# Patient Record
Sex: Male | Born: 1960 | Race: White | Hispanic: Yes | Marital: Single | Smoking: Never smoker
Health system: Southern US, Community
[De-identification: ages and names within clinical notes are randomized; demographics above are authoritative.]

## PROBLEM LIST (undated history)

## (undated) ENCOUNTER — Emergency Department (HOSPITAL_BASED_OUTPATIENT_CLINIC_OR_DEPARTMENT_OTHER): Admission: EM | Payer: Self-pay | Source: Home / Self Care

## (undated) DIAGNOSIS — T7840XA Allergy, unspecified, initial encounter: Secondary | ICD-10-CM

## (undated) DIAGNOSIS — M549 Dorsalgia, unspecified: Secondary | ICD-10-CM

## (undated) HISTORY — DX: Allergy, unspecified, initial encounter: T78.40XA

## (undated) HISTORY — PX: SHOULDER ARTHROSCOPY: SHX128

## (undated) HISTORY — PX: HIP SURGERY: SHX245

---

## 2014-02-05 ENCOUNTER — Telehealth: Payer: Self-pay | Admitting: Emergency Medicine

## 2014-02-05 NOTE — Telephone Encounter (Signed)
Called by patient from the airport in [place] with c/o CP and SOB onset 8 months ago. He was concerned about this as he was about to get on plane to [place]. He has never been seen by us, but feels that he should be seen soon. My attempts to discern whether he was experiencing acute distress were difficult. I underscored with him that if he were having acute CP, dyspnea or felt unstable in any way that he should seek care locally. He assured me that he felt safe and stable to fly home tonight, but he did understand my instructions should his sx change. I told him I would ask the office to call him as a self referral for pulm eval, new consult, on 12/9.   Please set him up next available with any provider, call him with the details. Thank you

## 2014-02-06 NOTE — Telephone Encounter (Signed)
Pt offered multiple appts with Dr Sherene SiresWert to try and get the patient in to be seen ASAP--pt refused to see Dr Sherene SiresWert. Pt given multiple Drs names and pt refused stating that per reviews and convenience he wishes to see Dr Vassie LollAlva. Appt given to pt to see Dr Vassie LollAlva 03/15/13 at 11:15. Pt aware to arrive [id number] early to fill out paperwork, bring any related paperwork to appt pertaining to current symptoms.  Nothing further needed.

## 2014-02-06 NOTE — Telephone Encounter (Signed)
Patient returning call.  [phone]

## 2014-02-06 NOTE — Telephone Encounter (Signed)
lmtcb for pt.  

## 2014-02-11 ENCOUNTER — Ambulatory Visit (INDEPENDENT_AMBULATORY_CARE_PROVIDER_SITE_OTHER): Payer: BC Managed Care – PPO | Admitting: Emergency Medicine

## 2014-02-11 ENCOUNTER — Ambulatory Visit (INDEPENDENT_AMBULATORY_CARE_PROVIDER_SITE_OTHER): Payer: BC Managed Care – PPO

## 2014-02-11 VITALS — BP 108/76 | HR 59 | Temp 98.0°F | Resp 16 | Ht 68.0 in | Wt 177.0 lb

## 2014-02-11 DIAGNOSIS — R0602 Shortness of breath: Secondary | ICD-10-CM

## 2014-02-11 DIAGNOSIS — M940 Chondrocostal junction syndrome [Tietze]: Secondary | ICD-10-CM

## 2014-02-11 MED ORDER — NAPROXEN SODIUM 550 MG PO TABS: 550.0000 mg | ORAL_TABLET | Freq: Two times a day (BID) | ORAL | Status: DC

## 2014-02-11 NOTE — Patient Instructions (Signed)
Costochondritis Costochondritis, sometimes called Tietze syndrome, is a swelling and irritation (inflammation) of the tissue (cartilage) that connects your ribs with your breastbone (sternum). It causes pain in the chest and rib area. Costochondritis usually goes away on its own over time. It can take up to 6 weeks or longer to get better, especially if you are unable to limit your activities. CAUSES  Some cases of costochondritis have no known cause. Possible causes include:  Injury (trauma).  Exercise or activity such as lifting.  Severe coughing. SIGNS AND SYMPTOMS  Pain and tenderness in the chest and rib area.  Pain that gets worse when coughing or taking deep breaths.  Pain that gets worse with specific movements. DIAGNOSIS  Your health care provider will do a physical exam and ask about your symptoms. Chest X-rays or other tests may be done to rule out other problems. TREATMENT  Costochondritis usually goes away on its own over time. Your health care provider may prescribe medicine to help relieve pain. HOME CARE INSTRUCTIONS   Avoid exhausting physical activity. Try not to strain your ribs during normal activity. This would include any activities using chest, abdominal, and side muscles, especially if heavy weights are used.  Apply ice to the affected area for the first 2 days after the pain begins.  Put ice in a plastic bag.  Place a towel between your skin and the bag.  Leave the ice on for 20 minutes, 2-3 times a day.  Only take over-the-counter or prescription medicines as directed by your health care provider. SEEK MEDICAL CARE IF:  You have redness or swelling at the rib joints. These are signs of infection.  Your pain does not go away despite rest or medicine. SEEK IMMEDIATE MEDICAL CARE IF:   Your pain increases or you are very uncomfortable.  You have shortness of breath or difficulty breathing.  You cough up blood.  You have worse chest pains,  sweating, or vomiting.  You have a fever or persistent symptoms for more than 2-3 days.  You have a fever and your symptoms suddenly get worse. MAKE SURE YOU:   Understand these instructions.  Will watch your condition.  Will get help right away if you are not doing well or get worse. Document Released: 11/25/2004 Document Revised: 12/06/2012 Document Reviewed: 09/19/2012 ExitCare Patient Information 2015 [employer]. This information is not intended to replace advice given to you by your health care provider. Make sure you discuss any questions you have with your health care provider.  

## 2014-02-11 NOTE — Progress Notes (Signed)
[facility] [address], [place] [address] [phone]  Date:  02/11/2014   Name:  Gordon Martinez   DOB:  1960/09/09   MRN:  [id number]  PCP:  No primary care provider on file.    Chief Complaint: Chest Pain and Nasal Congestion   History of Present Illness:  Gordon Martinez is a 53 y.o. very pleasant male patient who presents with the following:  Treated 8 months ago when he developed "congestion from the flu"  He describes this as a persistent cough, wheezing and shortness of breath. Symptoms have not improved and he sought an evaluation from a cardiologist who cleared him.  Has tenderness and has been rubbing his mid chest. Has not had a chest xray during this time frame. Non smoker.  CPA.   No fever or chills.  Cough is not productive. Says today feels that he has a "ball in his chest" as though he was punched in the chest.  Started Saturday and persists without interruption. Non radiating and no diaphoresis or nausea or vomiting. No HBP, DM or HLD Post nasal drainage. No improvement with over the counter medications or other home remedies.  Denies other complaint or health concern today.    There are no active problems to display for this patient.   Past Medical History  Diagnosis Date  . Allergy     Past Surgical History  Procedure Laterality Date  . Hip surgery      History  Substance Use Topics  . Smoking status: Never Smoker   . Smokeless tobacco: Not on file  . Alcohol Use: Not on file    No family history on file.  No Known Allergies  Medication list has been reviewed and updated.  No current outpatient prescriptions on file prior to visit.   No current facility-administered medications on file prior to visit.    Review of Systems:  As per HPI, otherwise negative.    Physical Examination: Filed Vitals:   02/11/14 1740  BP: 108/76  Pulse: 59  Temp: 98 F (36.7 C)  Resp: 16   Filed Vitals:   02/11/14 1740   Height: 5\' 8"  (1.727 m)  Weight: 177 lb (80.287 kg)   Body mass index is 26.92 kg/(m^2). Ideal Body Weight: Weight in (lb) to have BMI = 25: 164.1  GEN: WDWN, NAD, Non-toxic, A & O x 3 HEENT: Atraumatic, Normocephalic. Neck supple. No masses, No LAD. Ears and Nose: No external deformity. CV: RRR, No M/G/R. No JVD. No thrill. No extra heart sounds. Chest:  Tender 5th ICS costochondral joints PULM: CTA B, no wheezes, crackles, rhonchi. No retractions. No resp. distress. No accessory muscle use. ABD: S, NT, ND, +BS. No rebound. No HSM. EXTR: No c/c/e NEURO Normal gait.  PSYCH: Normally interactive. Conversant. Not depressed or anxious appearing.  Calm demeanor.    Assessment and Plan: Costochondritis Anaprox Red flags  Follow up if not relieved or new or worsened symptoms.  Signed,  Phillips OdorJeffery Wallice [place], MD   [facility] reading (PRIMARY) by  Dr. Dareen PianoAnderson negative chest with possible lesion left hilum.

## 2014-03-15 ENCOUNTER — Institutional Professional Consult (permissible substitution): Payer: Self-pay | Admitting: Pulmonary Disease

## 2014-04-24 ENCOUNTER — Institutional Professional Consult (permissible substitution): Payer: Self-pay | Admitting: Pulmonary Disease

## 2014-06-24 ENCOUNTER — Ambulatory Visit (INDEPENDENT_AMBULATORY_CARE_PROVIDER_SITE_OTHER): Payer: BLUE CROSS/BLUE SHIELD | Admitting: Pulmonary Disease

## 2014-06-24 ENCOUNTER — Encounter: Payer: Self-pay | Admitting: Pulmonary Disease

## 2014-06-24 ENCOUNTER — Encounter (INDEPENDENT_AMBULATORY_CARE_PROVIDER_SITE_OTHER): Payer: Self-pay

## 2014-06-24 VITALS — BP 100/64 | HR 67 | Ht 69.0 in | Wt 177.0 lb

## 2014-06-24 DIAGNOSIS — J989 Respiratory disorder, unspecified: Secondary | ICD-10-CM | POA: Diagnosis not present

## 2014-06-24 DIAGNOSIS — R0600 Dyspnea, unspecified: Secondary | ICD-10-CM

## 2014-06-24 DIAGNOSIS — R0602 Shortness of breath: Secondary | ICD-10-CM | POA: Diagnosis not present

## 2014-06-24 DIAGNOSIS — J399 Disease of upper respiratory tract, unspecified: Secondary | ICD-10-CM

## 2014-06-24 NOTE — Patient Instructions (Signed)
Lung function is good Do not need asthma medications ENT evaluation - if OK, we can discuss vocal cord dysfunction

## 2014-06-24 NOTE — Assessment & Plan Note (Signed)
he seems to indicate that he has an upper airway problem. I do not hear any stridor. He does not have a goiter. Spirometry appears normal and history is not typical for asthma. As such I doubt that further pulmonary workup is necessary here. I will refer him for an ENT evaluation. If that is normal, we will observe him for a period of time. If symptoms persist, we can evaluate for sinusitis or GERD. I would also consider a barium swallow . 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 He does not seem to be interested at this time.

## 2014-06-24 NOTE — Progress Notes (Signed)
   Subjective:    Patient ID: Ulice BrilliantGeorge Rebman, male    DOB: 1961-01-30, 54 y.o.   MRN: [id number]  HPI  54 year old never smoker presents for evaluation of shortness of breath. His main complaint is tightness of throat ongoing since 04/2013. This occurs at rest as well as on exercise, no specific triggers or alleviating factors. He denies difficulty swallowing liquids or solids. He denies cough, chest pain or wheezing His father had a thyroid nodule and if he wonders if this could be causing his problems. Urgent care visit in 01/2014, chest x-ray within normal limits He was evaluated by a cardiac stress test which was reportedly normal. Spirometry 05/2014 does not show any evidence of airway obstruction, ratio 80, FEV1 96% and FVC 95%. He has 5 cats including 2 from his daughter about 2 years ago. Otherwise he denies any changes in his environment. He denies nocturnal wheezing or dyspnea. 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 He is originally from [place], lived in [place], in [place] the last 16 years. He is divorced, denies substance abuse He does a heavy workout including running about an hour 5 times a week  Past Medical History  Diagnosis Date  . Allergy     Past Surgical History  Procedure Laterality Date  . Hip surgery      No Known Allergies  History   Social History  . Marital Status: Single    Spouse Name: N/A  . Number of Children: N/A  . Years of Education: N/A   Occupational History  . Not on file.   Social History Main Topics  . Smoking status: Never Smoker   . Smokeless tobacco: Not on file  . Alcohol Use: Not on file  . Drug Use: Not on file  . Sexual Activity: Not on file   Other Topics Concern  . Not on file   Social History Narrative    No family history on file.   Review of Systems  Constitutional: Negative for fever, chills, activity change, appetite change and unexpected weight change.  HENT: Negative for  congestion, dental problem, postnasal drip, rhinorrhea, sneezing, sore throat, trouble swallowing and voice change.   Eyes: Negative for visual disturbance.  Respiratory: Positive for shortness of breath. Negative for cough and choking.   Cardiovascular: Negative for chest pain and leg swelling.  Gastrointestinal: Negative for nausea, vomiting and abdominal pain.  Genitourinary: Negative for difficulty urinating.  Musculoskeletal: Negative for arthralgias.  Skin: Negative for rash.  Psychiatric/Behavioral: Negative for behavioral problems and confusion.       Objective:   Physical Exam  Gen. Pleasant, well-nourished, in no distress, normal affect ENT - no lesions, no post nasal drip Neck: No JVD, no thyromegaly, no carotid bruits Lungs: no use of accessory muscles, no dullness to percussion, clear without rales or rhonchi  Cardiovascular: Rhythm regular, heart sounds  normal, no murmurs or gallops, no peripheral edema Abdomen: soft and non-tender, no hepatosplenomegaly, BS normal. Musculoskeletal: No deformities, no cyanosis or clubbing Neuro:  alert, non focal       Assessment & Plan:

## 2014-07-10 ENCOUNTER — Other Ambulatory Visit: Payer: Self-pay | Admitting: Otolaryngology

## 2014-07-10 DIAGNOSIS — R1310 Dysphagia, unspecified: Secondary | ICD-10-CM

## 2014-07-10 DIAGNOSIS — R0789 Other chest pain: Secondary | ICD-10-CM

## 2014-07-10 DIAGNOSIS — R0689 Other abnormalities of breathing: Secondary | ICD-10-CM

## 2014-07-24 ENCOUNTER — Other Ambulatory Visit: Payer: Self-pay | Admitting: Otolaryngology

## 2014-07-24 ENCOUNTER — Ambulatory Visit
Admission: RE | Admit: 2014-07-24 | Discharge: 2014-07-24 | Disposition: A | Discharge status: Home / Self Care | Payer: BLUE CROSS/BLUE SHIELD | Source: Ambulatory Visit | Attending: Otolaryngology | Admitting: Otolaryngology

## 2014-07-24 DIAGNOSIS — R1310 Dysphagia, unspecified: Secondary | ICD-10-CM

## 2014-07-24 DIAGNOSIS — R0689 Other abnormalities of breathing: Secondary | ICD-10-CM

## 2014-07-24 DIAGNOSIS — J383 Other diseases of vocal cords: Secondary | ICD-10-CM

## 2014-07-24 DIAGNOSIS — R0789 Other chest pain: Secondary | ICD-10-CM

## 2014-07-24 MED ORDER — IOPAMIDOL (ISOVUE-300) INJECTION 61%
75.0000 mL | Freq: Once | INTRAVENOUS | Status: AC | PRN
  Administered 2014-07-24: 75 mL via INTRAVENOUS

## 2014-08-06 ENCOUNTER — Telehealth: Payer: Self-pay | Admitting: Pulmonary Disease

## 2014-08-06 NOTE — Telephone Encounter (Signed)
ENT eval neg ([facility]) CT neck neg

## 2015-12-24 ENCOUNTER — Emergency Department (HOSPITAL_BASED_OUTPATIENT_CLINIC_OR_DEPARTMENT_OTHER)
Admission: EM | Admit: 2015-12-24 | Discharge: 2015-12-24 | Disposition: A | Discharge status: Home / Self Care | Payer: BLUE CROSS/BLUE SHIELD | Attending: Emergency Medicine | Admitting: Emergency Medicine

## 2015-12-24 ENCOUNTER — Encounter (HOSPITAL_BASED_OUTPATIENT_CLINIC_OR_DEPARTMENT_OTHER): Payer: Self-pay | Admitting: *Deleted

## 2015-12-24 DIAGNOSIS — G8929 Other chronic pain: Secondary | ICD-10-CM | POA: Insufficient documentation

## 2015-12-24 DIAGNOSIS — M545 Low back pain, unspecified: Secondary | ICD-10-CM

## 2015-12-24 DIAGNOSIS — Z79891 Long term (current) use of opiate analgesic: Secondary | ICD-10-CM | POA: Diagnosis not present

## 2015-12-24 HISTORY — DX: Dorsalgia, unspecified: M54.9

## 2015-12-24 MED ORDER — METHOCARBAMOL 500 MG PO TABS
1000.0000 mg | ORAL_TABLET | Freq: Once | ORAL | Status: AC
  Administered 2015-12-24: 1000 mg via ORAL
  Filled 2015-12-24: qty 2

## 2015-12-24 MED ORDER — METHOCARBAMOL 500 MG PO TABS: 1000.0000 mg | ORAL_TABLET | Freq: Three times a day (TID) | ORAL | 0 refills | Status: DC | PRN

## 2015-12-24 MED ORDER — KETOROLAC TROMETHAMINE 60 MG/2ML IM SOLN
60.0000 mg | Freq: Once | INTRAMUSCULAR | Status: AC
  Administered 2015-12-24: 60 mg via INTRAMUSCULAR
  Filled 2015-12-24: qty 2

## 2015-12-24 MED ORDER — IBUPROFEN 600 MG PO TABS: 600.0000 mg | ORAL_TABLET | Freq: Four times a day (QID) | ORAL | 0 refills | Status: DC | PRN

## 2015-12-24 MED FILL — METHOCARBAMOL 500 MG TABLET: 500 | 5 days supply | Qty: 30 | Fill #0

## 2015-12-24 MED FILL — IBUPROFEN 600 MG TABLET: 600 | 7 days supply | Qty: 30 | Fill #0

## 2015-12-24 NOTE — Discharge Instructions (Signed)
Follow-up with Dr Leeanne Rio'gara for MRI and re-evaluation. Return for fever, weakness, numbness, incontinence or for any concerns.

## 2015-12-24 NOTE — ED Provider Notes (Signed)
[facility]   CSN: [id number] Arrival date & time: 12/24/15  1627     History   Chief Complaint Chief Complaint  Patient presents with  . Back Pain    HPI Gordon Martinez is a 55 y.o. male.  HPI Patient presents with episodic low back pain described as spasms. Pain does not radiate to the abdomen or to the legs. No urinary incontinence or retention. Denies hematuria. No fever or chills. No weakness or numbness.Patient's thinks he had a similar episode of back pain sometime earlier this year. He's taken hydrocodone at home with little relief. States pain worsened this morning after bending over. Patient is asking for MRI. Past Medical History:  Diagnosis Date  . Allergy   . Back pain     Patient Active Problem List   Diagnosis Date Noted  . Dyspnea 06/24/2014    Past Surgical History:  Procedure Laterality Date  . HIP SURGERY    . SHOULDER ARTHROSCOPY         Home Medications    Prior to Admission medications   Medication Sig Start Date End Date Taking? Authorizing Provider  traMADol (ULTRAM) 50 MG tablet Take 50 mg by mouth every 6 (six) hours as needed.   Yes Historical Provider, MD  ibuprofen (ADVIL,MOTRIN) 600 MG tablet Take 1 tablet (600 mg total) by mouth every 6 (six) hours as needed. 12/24/15   Loren Raceravid Lafreda Casebeer, MD  methocarbamol (ROBAXIN) 500 MG tablet Take 2 tablets (1,000 mg total) by mouth every 8 (eight) hours as needed for muscle spasms. 12/24/15   Loren Raceravid Catalino Plascencia, MD    Family History History reviewed. No pertinent family history.  Social History Social History  Substance Use Topics  . Smoking status: Never Smoker  . Smokeless tobacco: Not on file  . Alcohol use No     Allergies   Review of patient's allergies indicates no known allergies.   Review of Systems Review of Systems  Constitutional: Negative for chills and fever.  Respiratory: Negative for shortness of breath.   Cardiovascular: Negative for chest  pain.  Gastrointestinal: Negative for abdominal pain, constipation, diarrhea, nausea and vomiting.  Genitourinary: Negative for difficulty urinating, dysuria, flank pain, frequency, hematuria and urgency.  Musculoskeletal: Positive for back pain and myalgias. Negative for neck pain and neck stiffness.  Skin: Negative for pallor, rash and wound.  Neurological: Negative for dizziness, syncope, weakness, light-headedness, numbness and headaches.  All other systems reviewed and are negative.    Physical Exam Updated Vital Signs BP (!) 115/102 (BP Location: Right Arm)   Pulse 64   Temp 98.1 F (36.7 C) (Oral)   Resp 18   Ht 5\' 8"  (1.727 m)   Wt 175 lb (79.4 kg)   SpO2 99%   BMI 26.61 kg/m   Physical Exam  Constitutional: He is oriented to person, place, and time. He appears well-developed and well-nourished.  Quite anxious  HENT:  Head: Normocephalic and atraumatic.  Mouth/Throat: Oropharynx is clear and moist.  Eyes: EOM are normal. Pupils are equal, round, and reactive to light.  Neck: Normal range of motion. Neck supple.  Cardiovascular: Normal rate and regular rhythm.   Pulmonary/Chest: Effort normal and breath sounds normal.  Abdominal: Soft. Bowel sounds are normal. There is no tenderness. There is no rebound and no guarding.  No pulsatile masses  Musculoskeletal: Normal range of motion. He exhibits tenderness. He exhibits no edema or deformity.  Mild midline lower lumbar/sacral tenderness. Negative straight leg raise.No lower extremity  swelling or asymmetry.Distal pulses are equal  Neurological: He is alert and oriented to person, place, and time.  Patient is alert and oriented x3 with clear, goal oriented speech. Patient has 5/5 motor in all extremities. Sensation is intact to light touch. No saddle anesthesia  Skin: Skin is warm and dry. Capillary refill takes less than 2 seconds. No rash noted. No erythema.  Psychiatric: His behavior is normal.  Nursing note and vitals  reviewed.    ED Treatments / Results  Labs (all labs ordered are listed, but only abnormal results are displayed) Labs Reviewed - No data to display  EKG  EKG Interpretation None       Radiology No results found.  Procedures Procedures (including critical care time)  Medications Ordered in ED Medications  ketorolac (TORADOL) injection 60 mg (60 mg Intramuscular Given 12/24/15 1707)  methocarbamol (ROBAXIN) tablet 1,000 mg (1,000 mg Oral Given 12/24/15 1707)     Initial Impression / Assessment and Plan / ED Course  I have reviewed the triage vital signs and the nursing notes.  Pertinent labs & imaging results that were available during my care of the patient were reviewed by me and considered in my medical decision making (see chart for details).  Clinical Course   Review of patient's chart from care everywhere demonstrates that [facility] for similar pain. MRI of his lumbar spine as an outpatient is being arranged. No red flag signs or symptoms.We'll treat with anti-inflammatory and muscle relaxants. Patient is encouraged to follow-up with his orthopedist. Strict return precautions have been given.  Final Clinical Impressions(s) / ED Diagnoses   Final diagnoses:  Chronic midline low back pain without sciatica    New Prescriptions New Prescriptions   IBUPROFEN (ADVIL,MOTRIN) 600 MG TABLET    Take 1 tablet (600 mg total) by mouth every 6 (six) hours as needed.   METHOCARBAMOL (ROBAXIN) 500 MG TABLET    Take 2 tablets (1,000 mg total) by mouth every 8 (eight) hours as needed for muscle spasms.     Loren Racer, MD 12/24/15 [phone]

## 2017-04-30 ENCOUNTER — Other Ambulatory Visit: Payer: Self-pay

## 2017-04-30 ENCOUNTER — Encounter: Payer: Self-pay | Admitting: Family Medicine

## 2017-04-30 ENCOUNTER — Ambulatory Visit: Payer: BLUE CROSS/BLUE SHIELD | Admitting: Family Medicine

## 2017-04-30 VITALS — BP 125/76 | HR 72 | Temp 99.5°F | Resp 17 | Ht 68.0 in | Wt 178.0 lb

## 2017-04-30 DIAGNOSIS — J9801 Acute bronchospasm: Secondary | ICD-10-CM | POA: Diagnosis not present

## 2017-04-30 DIAGNOSIS — J0100 Acute maxillary sinusitis, unspecified: Secondary | ICD-10-CM

## 2017-04-30 MED ORDER — FLUTICASONE PROPIONATE 50 MCG/ACT NA SUSP: 2.0000 | Freq: Every day | NASAL | 6 refills | Status: AC

## 2017-04-30 MED ORDER — AZITHROMYCIN 250 MG PO TABS: ORAL_TABLET | ORAL | 0 refills | Status: AC

## 2017-04-30 NOTE — Patient Instructions (Addendum)
1 teaspoon of salt dissolved in 8 ounces of warm water to gargle    IF you received an x-ray today, you will receive an invoice from [facility]. Please contact [facility] at [phone] with questions or concerns regarding your invoice.   IF you received labwork today, you will receive an invoice from [facility]. Please contact [facility] at [phone] with questions or concerns regarding your invoice.   Our billing staff will not be able to assist you with questions regarding bills from these companies.  You will be contacted with the lab results as soon as they are available. The fastest way to get your results is to activate your My Chart account. Instructions are located on the last page of this paperwork. If you have not heard from [place] regarding the results in 2 weeks, please contact this office.     Bronchospasm, Adult Bronchospasm is a tightening of the airways going into the lungs. During an episode, it may be harder to breathe. You may cough, and you may make a whistling sound when you breathe (wheeze). This condition often affects people with asthma. What are the causes? This condition is caused by swelling and irritation in the airways. It can be triggered by:  An infection (common).  Seasonal allergies.  An allergic reaction.  Exercise.  Irritants. These include pollution, cigarette smoke, strong odors, aerosol sprays, and paint fumes.  Weather changes. Winds increase molds and pollens in the air. Cold air may cause swelling.  Stress and emotional upset.  What are the signs or symptoms? Symptoms of this condition include:  Wheezing. If the episode was triggered by an allergy, wheezing may start right away or hours later.  Nighttime coughing.  Frequent or severe coughing with a simple cold.  Chest tightness.  Shortness of breath.  Decreased ability to exercise.  How is this diagnosed? This condition is usually diagnosed with a review of  your medical history and a physical exam. Tests, such as lung function tests, are sometimes done to look for other conditions. The need for a chest X-ray depends on where the wheezing occurs and whether it is the first time you have wheezed. How is this treated? This condition may be treated with:  Inhaled medicines. These open up the airways and help you breathe. They can be taken with an inhaler or a nebulizer device.  Corticosteroid medicines. These may be given for severe bronchospasm, usually when it is associated with asthma.  Avoiding triggers, such as irritants, infection, or allergies.  Follow these instructions at home: Medicines  Take over-the-counter and prescription medicines only as told by your health care provider.  If you need to use an inhaler or nebulizer to take your medicine, ask your health care provider to explain how to use it correctly. If you were given a spacer, always use it with your inhaler. Lifestyle  Reduce the number of triggers in your home. To do this: ? Change your heating and air conditioning filter at least once a month. ? Limit your use of fireplaces and wood stoves. ? Do not smoke. Do not allow smoking in your home. ? Avoid using perfumes and fragrances. ? Get rid of pests, such as roaches and mice, and their droppings. ? Remove any mold from your home. ? Keep your house clean and dust free. Use unscented cleaning products. ? Replace carpet with wood, tile, or vinyl flooring. Carpet can trap dander and dust. ? Use allergy-proof pillows, mattress covers, and box spring covers. ? Wash  bed sheets and blankets every week in hot water. Dry them in a dryer. ? Use blankets that are made of polyester or cotton. ? Wash your hands often. ? Do not allow pets in your bedroom.  Avoid breathing in cold air when you exercise. General instructions  Have a plan for seeking medical care. Know when to call your health care provider and local emergency  services, and where to get emergency care.  Stay up to date on your immunizations.  When you have an episode of bronchospasm, stay calm. Try to relax and breathe more slowly.  If you have asthma, make sure you have an asthma action plan.  Keep all follow-up visits as told by your health care provider. This is important. Contact a health care provider if:  You have muscle aches.  You have chest pain.  The mucus that you cough up (sputum) changes from clear or white to yellow, green, gray, or bloody.  You have a fever.  Your sputum gets thicker. Get help right away if:  Your wheezing and coughing get worse, even after you take your prescribed medicines.  It gets even harder to breathe.  You develop severe chest pain. Summary  Bronchospasm is a tightening of the airways going into the lungs.  During an episode of bronchospasm, you may have a harder time breathing. You may cough and make a whistling sound when you breathe (wheeze).  Avoid exposure to triggers such as smoke, dust, mold, animal dander, and fragrances.  When you have an episode of bronchospasm, stay calm. Try to relax and breathe more slowly. This information is not intended to replace advice given to you by your health care provider. Make sure you discuss any questions you have with your health care provider. Document Released: 02/18/2003 Document Revised: 02/12/2016 Document Reviewed: 02/12/2016 [employer] Interactive Patient Education  2017 [employer]

## 2017-04-30 NOTE — Progress Notes (Signed)
Chief Complaint  Patient presents with  . chest congestion/cough    x 1 week but has gotten worse over the past couple of days, warm to touch but no temps taken.  Phlegm drainage is sometimes green and sometimes white.  Pt is taking robitussin for cough, last taken this morning.    HPI   This is a new patient here to day for Chest Congestion   Upper Respiratory Infection: Patient complains of cough with green sputum with difficulty breathing. In the past he was given Breo. He states that he has not used his Breo in the past 3 weeks. He is taking a montelukast.  Symptoms include facial pressure, congestion, productive cough No fevers or chills No n/v/d No skin rash He is drinking plenty of fluids.      Past Medical History:  Diagnosis Date  . Allergy   . Back pain     Current Outpatient Medications  Medication Sig Dispense Refill  . Fluticasone Furoate-Vilanterol (BREO ELLIPTA IN) Inhale into the lungs.    . montelukast (SINGULAIR) 10 MG tablet Take 10 mg by mouth at bedtime.    Marland Kitchen. azithromycin (ZITHROMAX) 250 MG tablet Take 2 tabs, then 1 tab each day after 6 tablet 0  . fluticasone (FLONASE) 50 MCG/ACT nasal spray Place 2 sprays into both nostrils daily. 16 g 6   No current facility-administered medications for this visit.     Allergies: No Known Allergies  Past Surgical History:  Procedure Laterality Date  . HIP SURGERY    . SHOULDER ARTHROSCOPY      Social History   Socioeconomic History  . Marital status: Single    Spouse name: Not on file  . Number of children: Not on file  . Years of education: Not on file  . Highest education level: Not on file  Social Needs  . Financial resource strain: Not on file  . Food insecurity - worry: Not on file  . Food insecurity - inability: Not on file  . Transportation needs - medical: Not on file  . Transportation needs - non-medical: Not on file  Occupational History  . Not on file  Tobacco Use  . Smoking status:  Never Smoker  . Smokeless tobacco: Never Used  Substance and Sexual Activity  . Alcohol use: No    Alcohol/week: 0.0 oz  . Drug use: No  . Sexual activity: Not on file  Other Topics Concern  . Not on file  Social History Narrative  . Not on file    No family history on file.   ROS Review of Systems See HPI Constitution: No fevers or chills No malaise No diaphoresis Skin: No rash or itching Eyes: no blurry vision, no double vision GU: no dysuria or hematuria Neuro: no dizziness or headaches  all others reviewed and negative   Objective: Vitals:   04/30/17 1532  BP: 125/76  Pulse: 72  Resp: 17  Temp: 99.5 F (37.5 C)  TempSrc: Oral  SpO2: 95%  Weight: 178 lb (80.7 kg)  Height: 5\' 8"  (1.727 m)    Physical Exam General: alert, oriented, in NAD Head: normocephalic, atraumatic, no sinus tenderness Eyes: EOM intact, no scleral icterus or conjunctival injection Ears: TM clear bilaterally Nose: mucosa +erythematous, +edematous Throat: no pharyngeal exudate or erythema Lymph: no posterior auricular, submental or cervical lymph adenopathy Heart: normal rate, normal sinus rhythm, no murmurs Lungs: clear to auscultation bilaterally, scant wheeze in the right lower lobe that cleared after coughing   Assessment  and Plan [place] was seen today for chest congestion/cough.  Diagnoses and all orders for this visit:  Acute bronchospasm -     azithromycin (ZITHROMAX) 250 MG tablet; Take 2 tabs, then 1 tab each day after  Other orders -     fluticasone (FLONASE) 50 MCG/ACT nasal spray; Place 2 sprays into both nostrils daily.  pt with early signs of sinusitis Will treat with zpak for his wheezing, cough and green mucus Resume Breo Monitor temperatures Return to clinic prn   Ishita Mcnerney A Creta Levin

## 2017-10-04 ENCOUNTER — Other Ambulatory Visit: Payer: Self-pay | Admitting: Orthopedic Surgery

## 2017-10-04 DIAGNOSIS — R609 Edema, unspecified: Secondary | ICD-10-CM

## 2017-10-04 DIAGNOSIS — R52 Pain, unspecified: Secondary | ICD-10-CM

## 2017-10-04 DIAGNOSIS — R531 Weakness: Secondary | ICD-10-CM

## 2017-10-05 ENCOUNTER — Inpatient Hospital Stay
Admission: RE | Admit: 2017-10-05 | Discharge: 2017-10-05 | Disposition: A | Discharge status: Home / Self Care | Payer: No Typology Code available for payment source | Source: Ambulatory Visit | Attending: Orthopedic Surgery | Admitting: Orthopedic Surgery

## 2017-10-06 ENCOUNTER — Ambulatory Visit
Admission: RE | Admit: 2017-10-06 | Discharge: 2017-10-06 | Disposition: A | Discharge status: Home / Self Care | Payer: BLUE CROSS/BLUE SHIELD | Source: Ambulatory Visit | Attending: Orthopedic Surgery | Admitting: Orthopedic Surgery

## 2017-10-06 ENCOUNTER — Other Ambulatory Visit: Payer: Self-pay | Admitting: Orthopedic Surgery

## 2017-10-06 DIAGNOSIS — R609 Edema, unspecified: Secondary | ICD-10-CM

## 2017-10-06 DIAGNOSIS — R52 Pain, unspecified: Secondary | ICD-10-CM

## 2017-10-06 DIAGNOSIS — R531 Weakness: Secondary | ICD-10-CM

## 2018-10-17 IMAGING — MR MR KNEE*L* W/O CM
4 of 7 series · 22 of 40 positions shown · non-contrast
Comparison: None.

CLINICAL DATA: Medial left knee pain for 1-2 months.

EXAM:
MRI OF THE LEFT KNEE WITHOUT CONTRAST
TECHNIQUE: Multiplanar, multisequence MR imaging of the knee was performed. No
intravenous contrast was administered.

[Series 3: T2 fat-sat · axial · 4.0mm · 0.50mm/px · z∈[-33,+81]mm · 5 of 24 slices shown]
[im 1/24]
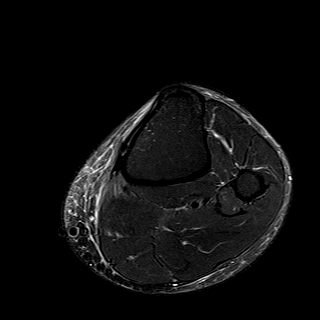
[im 5/24]
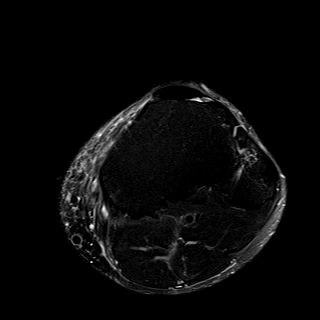
[im 10/24]
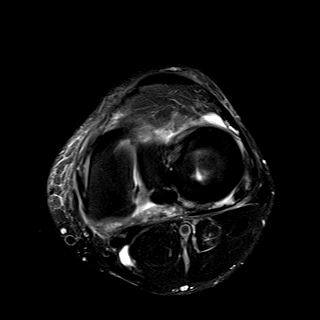
[im 14/24]
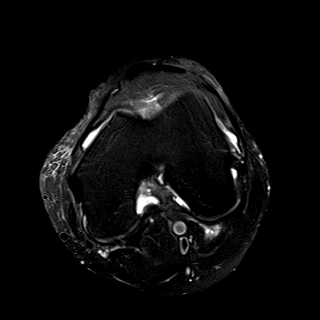
[im 24/24]
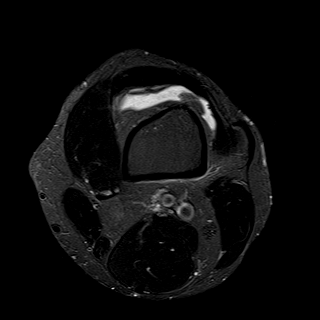

[Series 7: PD fat-sat · sagittal · 3.0mm · 0.29mm/px · 7 of 27 slices shown (1 of 3)]
[im 1/27]
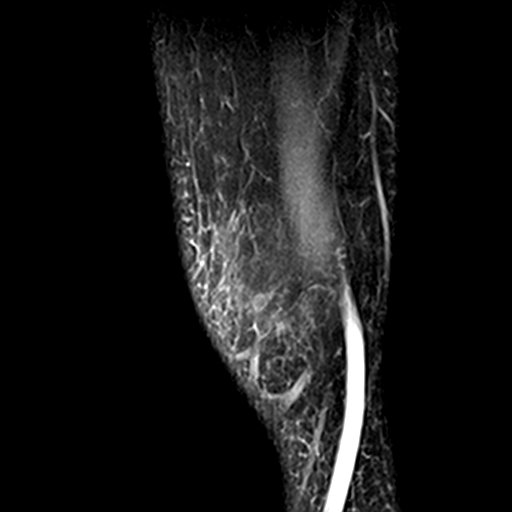
[im 5/27]
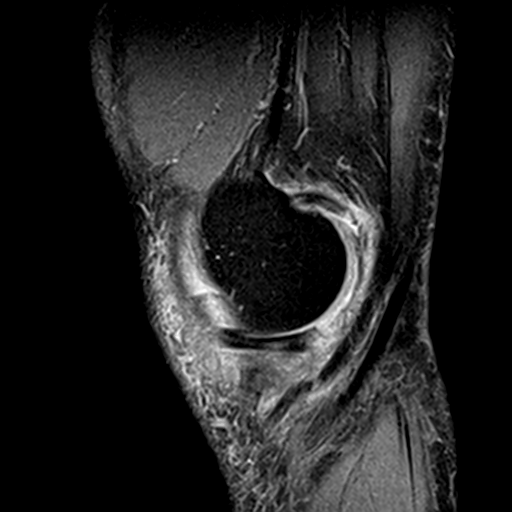
[im 9/27]
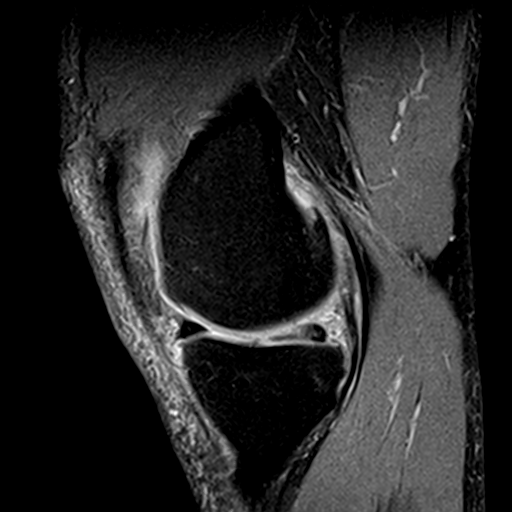
[im 14/27]
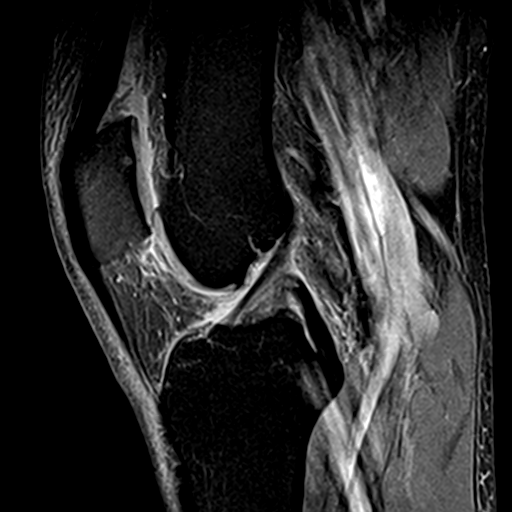
[im 18/27]
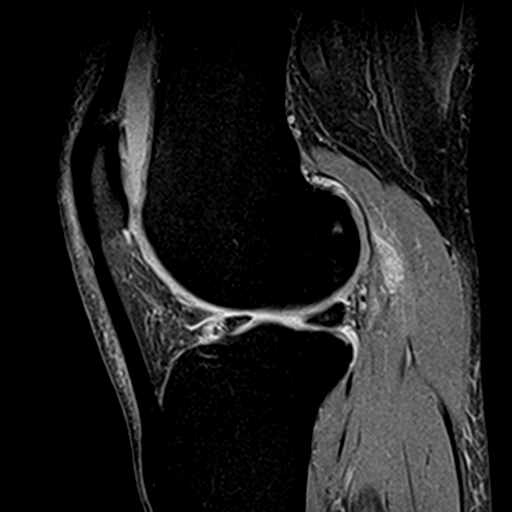
[im 22/27]
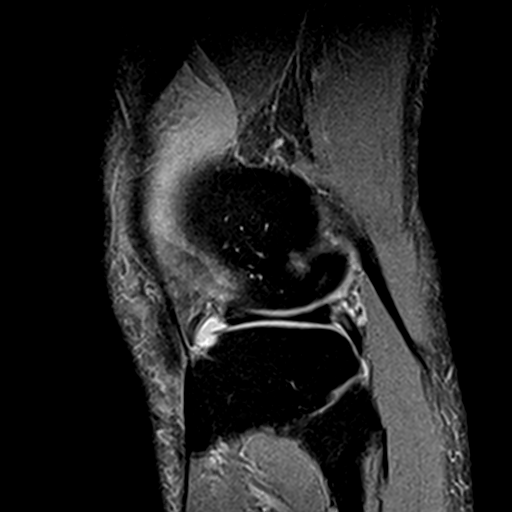
[im 27/27]
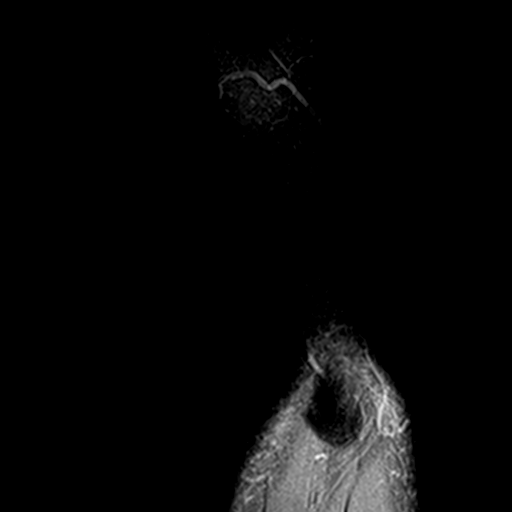

[Series 8: PD fat-sat · coronal · 3.0mm · 0.29mm/px · 7 of 28 slices shown (2 of 3)]
[im 1/28]
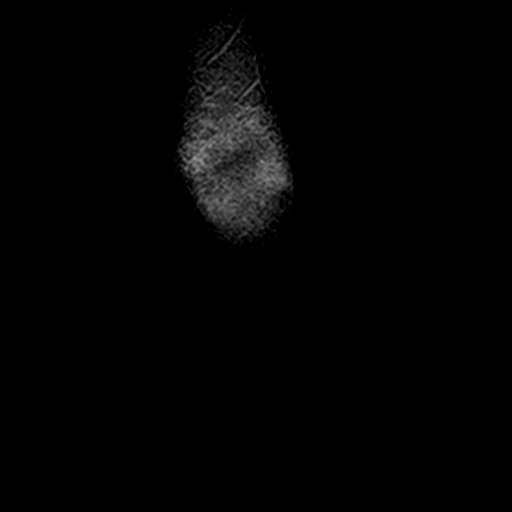
[im 5/28]
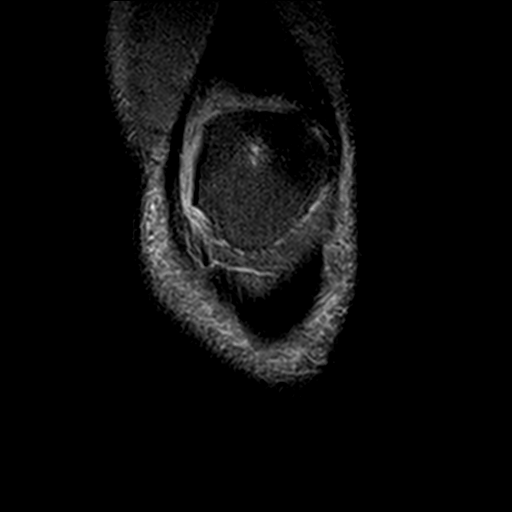
[im 10/28]
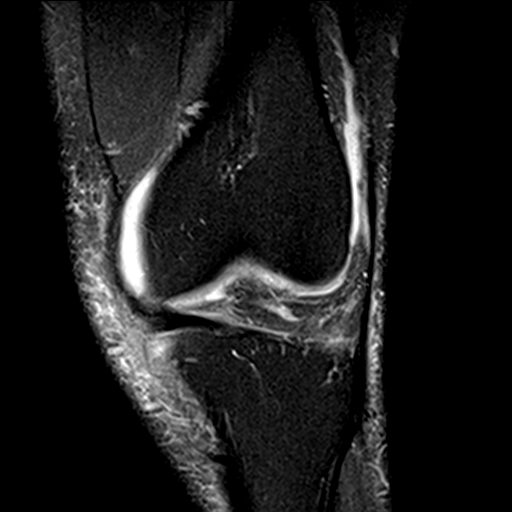
[im 14/28]
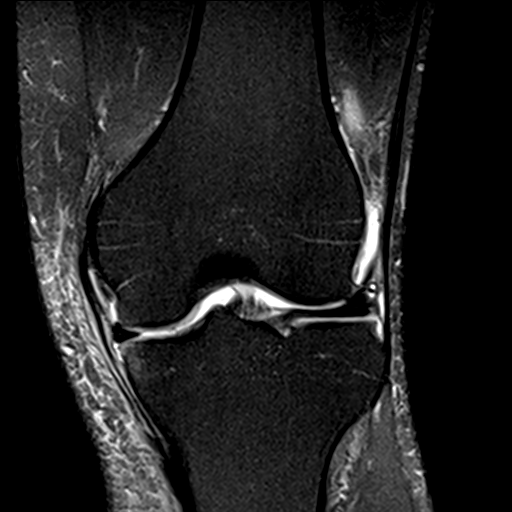
[im 19/28]
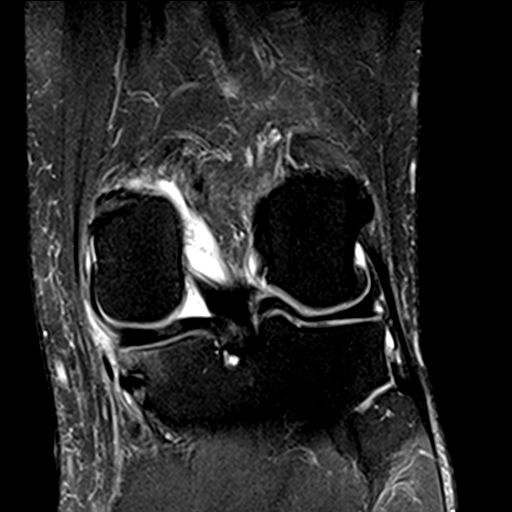
[im 23/28]
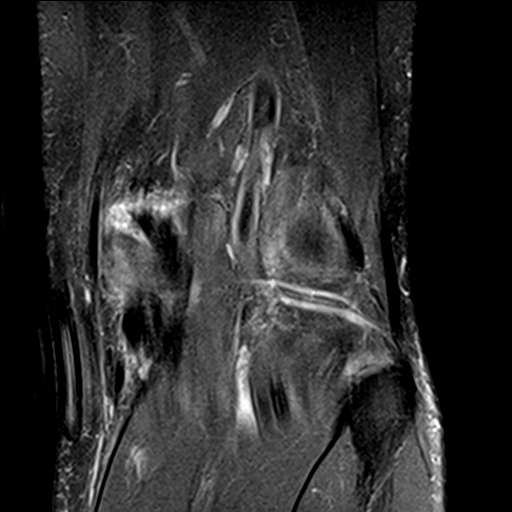
[im 28/28]
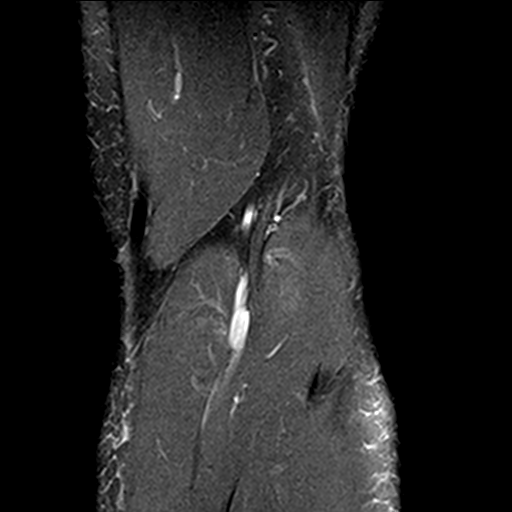

[Series 9: PD fat-sat · oblique · 2.0mm · 0.29mm/px · 3 of 11 slices shown (3 of 3)]
[im 1/11]
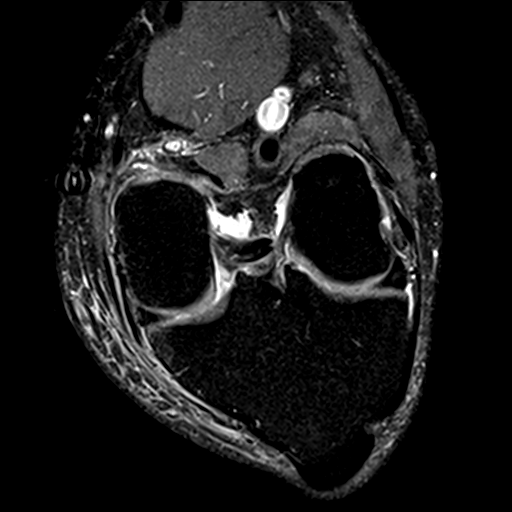
[im 6/11]
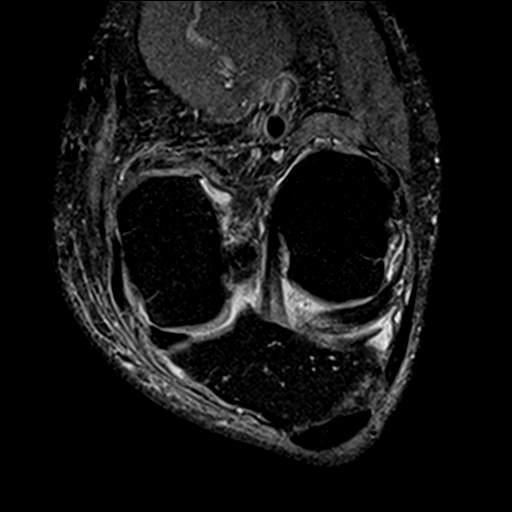
[im 11/11]
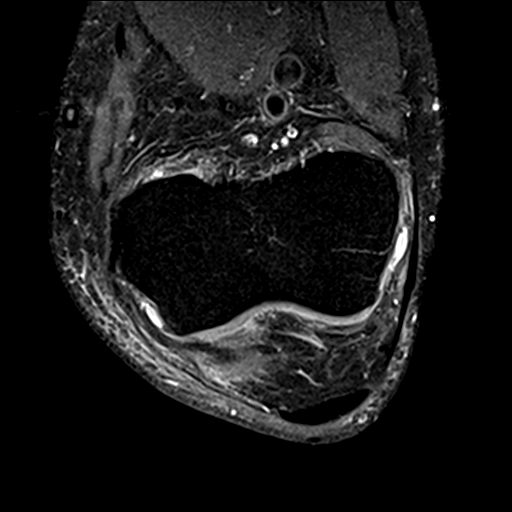

[22 of 40 positions shown; findings below may reference images not displayed]

FINDINGS: MENISCI

Medial meniscus: Small free edge tear of the posterior horn on image
[DATE] with adjacent mild degenerative tearing of the posterior horn.
There is some accentuated signal along the inferior surface of the
posterior horn on image [DATE].

Lateral meniscus:  Unremarkable

LIGAMENTS

Cruciates:  Unremarkable

Collaterals: Thickened proximal MCL with mild edema tracking along
the superficial margin of the MCL.

CARTILAGE

Patellofemoral: Mild to moderate degenerative chondral thinning
marrow edema along. Focus of subcortical the medial portion of the
lateral patellar facet on image [DATE]. Mild chondral fissuring
centrally along the femoral trochlear groove.

Medial:  Moderate degenerative chondral thinning.

Lateral:  Moderate degenerative chondral thinning.

Joint:  Small knee joint effusion.

Popliteal Fossa: Small Baker's cyst. Small posterolateral popliteal
cyst on image [DATE] adjacent to the proximal soleus muscle.

Extensor Mechanism:  Unremarkable

Bones: No significant extra-articular osseous abnormalities
identified.

Other: Subcutaneous edema medially along the knee.
IMPRESSION: 1. Torn posterior horn medial meniscus with small free edge
component and a degenerative component.
2. Grade 2 sprain of the MCL.
3. Moderate osteoarthritis.
4. Small knee joint effusion with small Baker's cyst. Small
posterolateral popliteal cyst adjacent to the proximal soleus
muscle.
5. Medial subcutaneous edema.
